# Patient Record
Sex: Male | Born: 1960 | Race: Black or African American | Hispanic: No | Marital: Married | Smoking: Current every day smoker
Health system: Southern US, Community
[De-identification: ages and names within clinical notes are randomized; demographics above are authoritative.]

---

## 2000-10-29 ENCOUNTER — Emergency Department (HOSPITAL_COMMUNITY): Admission: RE | Admit: 2000-10-29 | Discharge: 2000-10-30 | Payer: Self-pay | Admitting: Emergency Medicine

## 2000-10-29 ENCOUNTER — Encounter: Payer: Self-pay | Admitting: Emergency Medicine

## 2000-11-10 ENCOUNTER — Emergency Department (HOSPITAL_COMMUNITY): Admission: EM | Admit: 2000-11-10 | Discharge: 2000-11-10 | Payer: Self-pay | Admitting: Emergency Medicine

## 2002-01-28 ENCOUNTER — Inpatient Hospital Stay (HOSPITAL_COMMUNITY): Admission: RE | Admit: 2002-01-28 | Discharge: 2002-01-29 | Payer: Self-pay | Admitting: Emergency Medicine

## 2002-01-31 ENCOUNTER — Emergency Department (HOSPITAL_COMMUNITY): Admission: EM | Admit: 2002-01-31 | Discharge: 2002-01-31 | Payer: Self-pay | Admitting: Emergency Medicine

## 2002-07-14 ENCOUNTER — Observation Stay (HOSPITAL_COMMUNITY): Admission: AC | Admit: 2002-07-14 | Discharge: 2002-07-16 | Payer: Self-pay

## 2005-05-20 ENCOUNTER — Emergency Department (HOSPITAL_COMMUNITY): Admission: EM | Admit: 2005-05-20 | Discharge: 2005-05-20 | Payer: Self-pay | Admitting: Emergency Medicine

## 2006-01-24 ENCOUNTER — Inpatient Hospital Stay (HOSPITAL_COMMUNITY): Admission: EM | Admit: 2006-01-24 | Discharge: 2006-01-25 | Payer: Self-pay | Admitting: Emergency Medicine

## 2006-11-29 ENCOUNTER — Emergency Department (HOSPITAL_COMMUNITY): Admission: EM | Admit: 2006-11-29 | Discharge: 2006-11-29 | Payer: Self-pay | Admitting: Emergency Medicine

## 2011-05-31 ENCOUNTER — Emergency Department (HOSPITAL_COMMUNITY)
Admission: EM | Admit: 2011-05-31 | Discharge: 2011-06-01 | Disposition: A | Discharge status: Home / Self Care | Payer: Self-pay | Attending: Emergency Medicine | Admitting: Emergency Medicine

## 2011-05-31 DIAGNOSIS — K625 Hemorrhage of anus and rectum: Secondary | ICD-10-CM | POA: Insufficient documentation

## 2011-05-31 DIAGNOSIS — K644 Residual hemorrhoidal skin tags: Secondary | ICD-10-CM | POA: Insufficient documentation

## 2011-05-31 DIAGNOSIS — F1010 Alcohol abuse, uncomplicated: Secondary | ICD-10-CM | POA: Insufficient documentation

## 2011-05-31 LAB — DIFFERENTIAL
Basophils Absolute: 0.1 10*3/uL (ref 0.0–0.1)
Basophils Relative: 1 % (ref 0–1)
Eosinophils Absolute: 0.2 10*3/uL (ref 0.0–0.7)
Eosinophils Relative: 3 % (ref 0–5)
Lymphocytes Relative: 57 % — ABNORMAL HIGH (ref 12–46)
Lymphs Abs: 3.6 10*3/uL (ref 0.7–4.0)
Monocytes Absolute: 0.4 10*3/uL (ref 0.1–1.0)
Monocytes Relative: 7 % (ref 3–12)
Neutro Abs: 2 10*3/uL (ref 1.7–7.7)
Neutrophils Relative %: 32 % — ABNORMAL LOW (ref 43–77)

## 2011-05-31 LAB — COMPREHENSIVE METABOLIC PANEL
ALT: 27 U/L (ref 0–53)
AST: 40 U/L — ABNORMAL HIGH (ref 0–37)
Albumin: 2.8 g/dL — ABNORMAL LOW (ref 3.5–5.2)
Alkaline Phosphatase: 105 U/L (ref 39–117)
BUN: 8 mg/dL (ref 6–23)
CO2: 22 mEq/L (ref 19–32)
Calcium: 8.6 mg/dL (ref 8.4–10.5)
Chloride: 104 mEq/L (ref 96–112)
Creatinine, Ser: 0.76 mg/dL (ref 0.50–1.35)
GFR calc Af Amer: >60 mL/min (ref >60)
GFR calc non Af Amer: >60 mL/min (ref >60)
Glucose, Bld: 100 mg/dL — ABNORMAL HIGH (ref 70–99)
Potassium: 4 mEq/L (ref 3.5–5.1)
Sodium: 137 mEq/L (ref 135–145)
Total Bilirubin: 0.2 mg/dL — ABNORMAL LOW (ref 0.3–1.2)
Total Protein: 8 g/dL (ref 6.0–8.3)

## 2011-05-31 LAB — ETHANOL: Alcohol, Ethyl (B): 238 mg/dL — ABNORMAL HIGH (ref 0–11)

## 2011-05-31 LAB — CBC
HCT: 36.8 % — ABNORMAL LOW (ref 39.0–52.0)
Hemoglobin: 13.3 g/dL (ref 13.0–17.0)
MCH: 31.1 pg (ref 26.0–34.0)
MCHC: 36.1 g/dL — ABNORMAL HIGH (ref 30.0–36.0)
MCV: 86.2 fL (ref 78.0–100.0)
Platelets: 221 10*3/uL (ref 150–400)
RBC: 4.27 MIL/uL (ref 4.22–5.81)
RDW: 14.7 % (ref 11.5–15.5)
WBC: 6.2 10*3/uL (ref 4.0–10.5)

## 2016-10-04 ENCOUNTER — Emergency Department (HOSPITAL_COMMUNITY): Payer: Self-pay

## 2016-10-04 ENCOUNTER — Encounter (HOSPITAL_COMMUNITY): Payer: Self-pay | Admitting: *Deleted

## 2016-10-04 ENCOUNTER — Emergency Department (HOSPITAL_COMMUNITY)
Admission: EM | Admit: 2016-10-04 | Discharge: 2016-10-04 | Disposition: A | Discharge status: Home / Self Care | Payer: Self-pay | Attending: Emergency Medicine | Admitting: Emergency Medicine

## 2016-10-04 DIAGNOSIS — F17200 Nicotine dependence, unspecified, uncomplicated: Secondary | ICD-10-CM | POA: Insufficient documentation

## 2016-10-04 DIAGNOSIS — S01112A Laceration without foreign body of left eyelid and periocular area, initial encounter: Secondary | ICD-10-CM | POA: Insufficient documentation

## 2016-10-04 DIAGNOSIS — S0181XA Laceration without foreign body of other part of head, initial encounter: Secondary | ICD-10-CM

## 2016-10-04 DIAGNOSIS — R51 Headache: Secondary | ICD-10-CM | POA: Insufficient documentation

## 2016-10-04 DIAGNOSIS — Y9289 Other specified places as the place of occurrence of the external cause: Secondary | ICD-10-CM | POA: Insufficient documentation

## 2016-10-04 DIAGNOSIS — M542 Cervicalgia: Secondary | ICD-10-CM | POA: Insufficient documentation

## 2016-10-04 DIAGNOSIS — M25531 Pain in right wrist: Secondary | ICD-10-CM | POA: Insufficient documentation

## 2016-10-04 DIAGNOSIS — Y999 Unspecified external cause status: Secondary | ICD-10-CM | POA: Insufficient documentation

## 2016-10-04 DIAGNOSIS — Y939 Activity, unspecified: Secondary | ICD-10-CM | POA: Insufficient documentation

## 2016-10-04 DIAGNOSIS — S0232XA Fracture of orbital floor, left side, initial encounter for closed fracture: Secondary | ICD-10-CM | POA: Insufficient documentation

## 2016-10-04 DIAGNOSIS — S0285XB Fracture of orbit, unspecified, initial encounter for open fracture: Secondary | ICD-10-CM

## 2016-10-04 MED ORDER — LIDOCAINE-EPINEPHRINE-TETRACAINE (LET) SOLUTION
3.0000 mL | Freq: Once | NASAL | Status: AC
  Administered 2016-10-04: 3 mL via TOPICAL
  Filled 2016-10-04: qty 3

## 2016-10-04 MED ORDER — ERYTHROMYCIN 5 MG/GM OP OINT: TOPICAL_OINTMENT | OPHTHALMIC | 1 refills | Status: AC

## 2016-10-04 MED ORDER — NAPROXEN 250 MG PO TABS: 250.0000 mg | ORAL_TABLET | Freq: Two times a day (BID) | ORAL | 0 refills | Status: AC

## 2016-10-04 MED ORDER — LIDOCAINE HCL (PF) 1 % IJ SOLN
5.0000 mL | Freq: Once | INTRAMUSCULAR | Status: AC
  Administered 2016-10-04: 5 mL
  Filled 2016-10-04: qty 5

## 2016-10-04 MED ORDER — TETANUS-DIPHTH-ACELL PERTUSSIS 5-2.5-18.5 LF-MCG/0.5 IM SUSP
0.5000 mL | Freq: Once | INTRAMUSCULAR | Status: AC
  Administered 2016-10-04: 0.5 mL via INTRAMUSCULAR
  Filled 2016-10-04: qty 0.5

## 2016-10-04 NOTE — Discharge Instructions (Signed)
Stitches out in 5-7 days. Please place erythromycin ointment twice a day over wound. Please follow up with ophthalmologist Dr. Toni ArthursFuller. If you have double vision, trouble with your vision or trouble moving your eyes you need to return to the ER immediately.

## 2016-10-04 NOTE — ED Notes (Signed)
Declined W/C at D/C and was escorted to lobby by RN. 
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
## 2016-10-04 NOTE — ED Provider Notes (Signed)
Laceration repair by me. See Will Dansie's note for further   LACERATION REPAIR Performed by: Georgiana ShoreJessica B Mitchell Authorized by: Georgiana ShoreJessica B Mitchell Consent: Verbal consent obtained. Risks and benefits: risks, benefits and alternatives were discussed Consent given by: patient Patient identity confirmed: provided demographic data Prepped and Draped in normal sterile fashion Wound explored  Laceration Location: left supraorbital  Laceration Length: 3 cm  No Foreign Bodies seen or palpated  Anesthesia: local infiltration  Local anesthetic: lidocaine 1% w/o epinephrine  Anesthetic total: 1 ml  Irrigation method: syringe Amount of cleaning: standard  Skin closure: 5.0 prolene  Number of sutures: 3  Technique: simple interrupted  Patient tolerance: Patient tolerated the procedure well with no immediate complications.   Georgiana ShoreJessica B Mitchell, PA-C 10/04/16 1515    Laurence Spatesachel Morgan Little, MD 10/05/16 1523    Laurence Spatesachel Morgan Little, MD 10/05/16 [id number]

## 2020-01-21 ENCOUNTER — Emergency Department (HOSPITAL_COMMUNITY): Payer: Self-pay

## 2020-01-21 ENCOUNTER — Other Ambulatory Visit: Payer: Self-pay

## 2020-01-21 ENCOUNTER — Emergency Department (HOSPITAL_COMMUNITY)
Admission: EM | Admit: 2020-01-21 | Discharge: 2020-01-22 | Disposition: A | Discharge status: Home / Self Care | Payer: Self-pay | Attending: Emergency Medicine | Admitting: Emergency Medicine

## 2020-01-21 DIAGNOSIS — Y908 Blood alcohol level of 240 mg/100 ml or more: Secondary | ICD-10-CM | POA: Insufficient documentation

## 2020-01-21 DIAGNOSIS — S0990XA Unspecified injury of head, initial encounter: Secondary | ICD-10-CM | POA: Insufficient documentation

## 2020-01-21 DIAGNOSIS — Y9389 Activity, other specified: Secondary | ICD-10-CM | POA: Insufficient documentation

## 2020-01-21 DIAGNOSIS — F17210 Nicotine dependence, cigarettes, uncomplicated: Secondary | ICD-10-CM | POA: Insufficient documentation

## 2020-01-21 DIAGNOSIS — Y929 Unspecified place or not applicable: Secondary | ICD-10-CM | POA: Insufficient documentation

## 2020-01-21 DIAGNOSIS — R52 Pain, unspecified: Secondary | ICD-10-CM

## 2020-01-21 DIAGNOSIS — F10929 Alcohol use, unspecified with intoxication, unspecified: Secondary | ICD-10-CM | POA: Insufficient documentation

## 2020-01-21 DIAGNOSIS — Z791 Long term (current) use of non-steroidal anti-inflammatories (NSAID): Secondary | ICD-10-CM | POA: Insufficient documentation

## 2020-01-21 DIAGNOSIS — F10920 Alcohol use, unspecified with intoxication, uncomplicated: Secondary | ICD-10-CM

## 2020-01-21 DIAGNOSIS — Y999 Unspecified external cause status: Secondary | ICD-10-CM | POA: Insufficient documentation

## 2020-01-21 LAB — CBC
HCT: 43.3 % (ref 39.0–52.0)
Hemoglobin: 14 g/dL (ref 13.0–17.0)
MCH: 31 pg (ref 26.0–34.0)
MCHC: 32.3 g/dL (ref 30.0–36.0)
MCV: 96 fL (ref 80.0–100.0)
Platelets: 211 10*3/uL (ref 150–400)
RBC: 4.51 MIL/uL (ref 4.22–5.81)
RDW: 14.2 % (ref 11.5–15.5)
WBC: 7.4 10*3/uL (ref 4.0–10.5)
nRBC: 0 % (ref 0.0–0.2)

## 2020-01-21 LAB — COMPREHENSIVE METABOLIC PANEL
ALT: 18 U/L (ref 0–44)
AST: 30 U/L (ref 15–41)
Albumin: 3.3 g/dL — ABNORMAL LOW (ref 3.5–5.0)
Alkaline Phosphatase: 78 U/L (ref 38–126)
Anion gap: 11 (ref 5–15)
BUN: 7 mg/dL (ref 6–20)
CO2: 21 mmol/L — ABNORMAL LOW (ref 22–32)
Calcium: 8.6 mg/dL — ABNORMAL LOW (ref 8.9–10.3)
Chloride: 110 mmol/L (ref 98–111)
Creatinine, Ser: 0.76 mg/dL (ref 0.61–1.24)
GFR calc Af Amer: >60 mL/min (ref >60)
GFR calc non Af Amer: >60 mL/min (ref >60)
Glucose, Bld: 114 mg/dL — ABNORMAL HIGH (ref 70–99)
Potassium: 3.7 mmol/L (ref 3.5–5.1)
Sodium: 142 mmol/L (ref 135–145)
Total Bilirubin: 0.4 mg/dL (ref 0.3–1.2)
Total Protein: 7.4 g/dL (ref 6.5–8.1)

## 2020-01-21 LAB — ETHANOL: Alcohol, Ethyl (B): 289 mg/dL — ABNORMAL HIGH (ref <10)
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
## 2020-01-21 NOTE — ED Notes (Signed)
Pt removed cardiac monitor and refusing to be put back on monitor.

## 2020-01-21 NOTE — ED Notes (Signed)
Pt transported to CT ?

## 2020-01-22 NOTE — ED Notes (Signed)
Pt is dressed and ambulating steadily in room

## 2020-01-22 NOTE — ED Notes (Signed)
Pt asleep, sats 88-90%. Placed on [facility] while asleep

## 2020-01-22 NOTE — ED Notes (Signed)
Patient verbalizes understanding of discharge instructions. Opportunity for questioning and answers were provided. Armband removed by staff, pt discharged from ED. Ambulated out to lobby  

## 2020-01-22 NOTE — ED Provider Notes (Signed)
5:56 AM Patient reassessed several times through the night.  He is alert and oriented.  Ambulating and eating/drinking.  No additional symptoms.  Appears stable for discharge.   Roxy Horseman, PA-C 01/22/20 0557    Glynn Octave, MD 01/22/20 [phone]

## 2020-01-22 NOTE — ED Notes (Signed)
Pt given [place] sandwich and sprite, sitting up and eating

## 2020-02-25 ENCOUNTER — Ambulatory Visit: Payer: Self-pay

## 2021-10-10 ENCOUNTER — Other Ambulatory Visit: Payer: Self-pay

## 2021-10-10 ENCOUNTER — Emergency Department (HOSPITAL_COMMUNITY): Payer: Self-pay

## 2021-10-10 ENCOUNTER — Encounter (HOSPITAL_COMMUNITY): Payer: Self-pay | Admitting: Emergency Medicine

## 2021-10-10 ENCOUNTER — Emergency Department (HOSPITAL_COMMUNITY)
Admission: EM | Admit: 2021-10-10 | Discharge: 2021-10-10 | Disposition: A | Discharge status: Home / Self Care | Payer: Self-pay | Attending: Emergency Medicine | Admitting: Emergency Medicine

## 2021-10-10 DIAGNOSIS — Z5321 Procedure and treatment not carried out due to patient leaving prior to being seen by health care provider: Secondary | ICD-10-CM | POA: Insufficient documentation

## 2021-10-10 DIAGNOSIS — F10129 Alcohol abuse with intoxication, unspecified: Secondary | ICD-10-CM | POA: Insufficient documentation

## 2021-10-10 DIAGNOSIS — F1010 Alcohol abuse, uncomplicated: Secondary | ICD-10-CM

## 2021-10-10 DIAGNOSIS — Y908 Blood alcohol level of 240 mg/100 ml or more: Secondary | ICD-10-CM | POA: Insufficient documentation

## 2021-10-10 DIAGNOSIS — R142 Eructation: Secondary | ICD-10-CM | POA: Insufficient documentation

## 2021-10-10 DIAGNOSIS — R0789 Other chest pain: Secondary | ICD-10-CM | POA: Insufficient documentation

## 2021-10-10 LAB — CBC WITH DIFFERENTIAL/PLATELET
Abs Immature Granulocytes: 0.02 10*3/uL (ref 0.00–0.07)
Basophils Absolute: 0.1 10*3/uL (ref 0.0–0.1)
Basophils Relative: 1 %
Eosinophils Absolute: 0.2 10*3/uL (ref 0.0–0.5)
Eosinophils Relative: 3 %
HCT: 43.6 % (ref 39.0–52.0)
Hemoglobin: 15.2 g/dL (ref 13.0–17.0)
Immature Granulocytes: 0 %
Lymphocytes Relative: 66 %
Lymphs Abs: 4 10*3/uL (ref 0.7–4.0)
MCH: 32.2 pg (ref 26.0–34.0)
MCHC: 34.9 g/dL (ref 30.0–36.0)
MCV: 92.4 fL (ref 80.0–100.0)
Monocytes Absolute: 0.3 10*3/uL (ref 0.1–1.0)
Monocytes Relative: 5 %
Neutro Abs: 1.6 10*3/uL — ABNORMAL LOW (ref 1.7–7.7)
Neutrophils Relative %: 25 %
Platelets: 207 10*3/uL (ref 150–400)
RBC: 4.72 MIL/uL (ref 4.22–5.81)
RDW: 13.7 % (ref 11.5–15.5)
WBC: 6.2 10*3/uL (ref 4.0–10.5)
nRBC: 0 % (ref 0.0–0.2)

## 2021-10-10 LAB — COMPREHENSIVE METABOLIC PANEL
ALT: 22 U/L (ref 0–44)
AST: 38 U/L (ref 15–41)
Albumin: 3.4 g/dL — ABNORMAL LOW (ref 3.5–5.0)
Alkaline Phosphatase: 84 U/L (ref 38–126)
Anion gap: 10 (ref 5–15)
BUN: 9 mg/dL (ref 6–20)
CO2: 20 mmol/L — ABNORMAL LOW (ref 22–32)
Calcium: 8.7 mg/dL — ABNORMAL LOW (ref 8.9–10.3)
Chloride: 107 mmol/L (ref 98–111)
Creatinine, Ser: 0.92 mg/dL (ref 0.61–1.24)
GFR, Estimated: >60 mL/min (ref >60)
Glucose, Bld: 111 mg/dL — ABNORMAL HIGH (ref 70–99)
Potassium: 3.5 mmol/L (ref 3.5–5.1)
Sodium: 137 mmol/L (ref 135–145)
Total Bilirubin: 0.5 mg/dL (ref 0.3–1.2)
Total Protein: 7.9 g/dL (ref 6.5–8.1)

## 2021-10-10 LAB — ETHANOL: Alcohol, Ethyl (B): 389 mg/dL (ref <10)

## 2021-10-10 LAB — LIPASE, BLOOD: Lipase: 45 U/L (ref 11–51)

## 2021-10-10 NOTE — ED Notes (Signed)
Pt cursing at staff to take him to the lobby. Wheeled to lobby exit
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
## 2022-10-14 IMAGING — CR DG RIBS W/ CHEST 3+V*L*
4 series · 4 of 4 positions shown · non-contrast
Comparison: Chest radiograph 01/21/2020

CLINICAL DATA: Left rib pain

EXAM:
LEFT RIBS AND CHEST - 3+ VIEW

[rib ap obl]
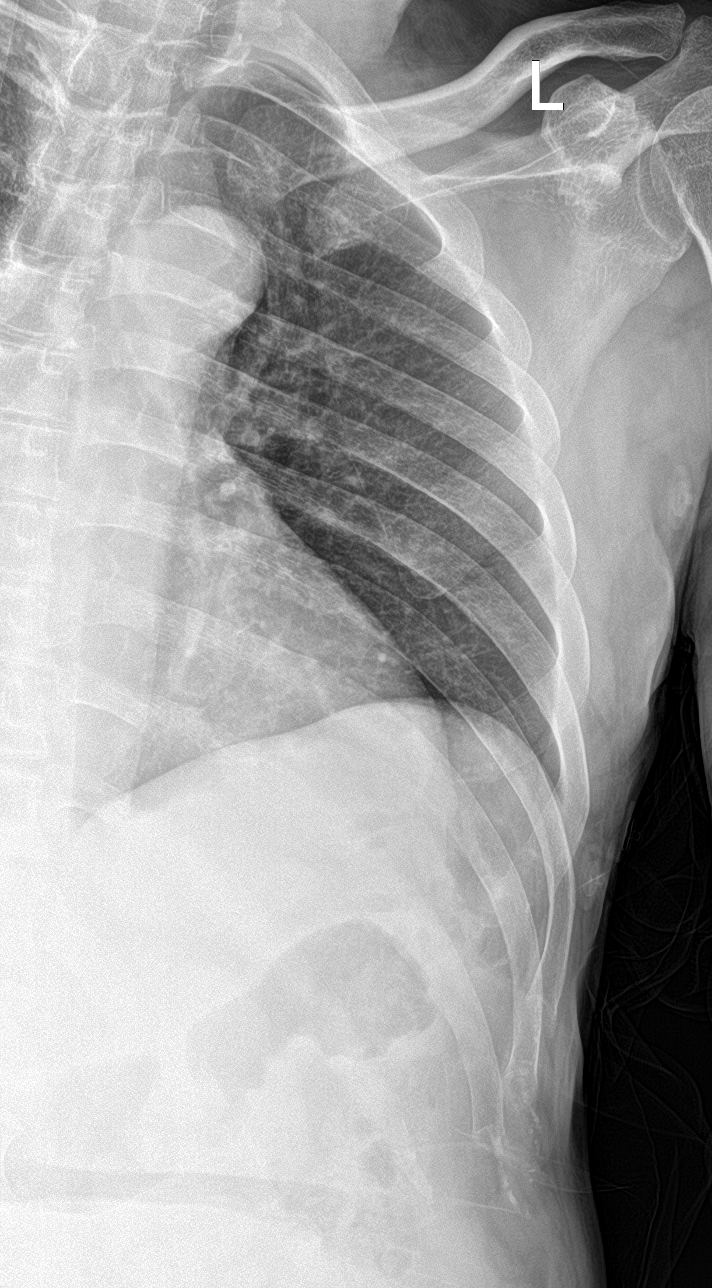

[rib ap (1 of 2)]
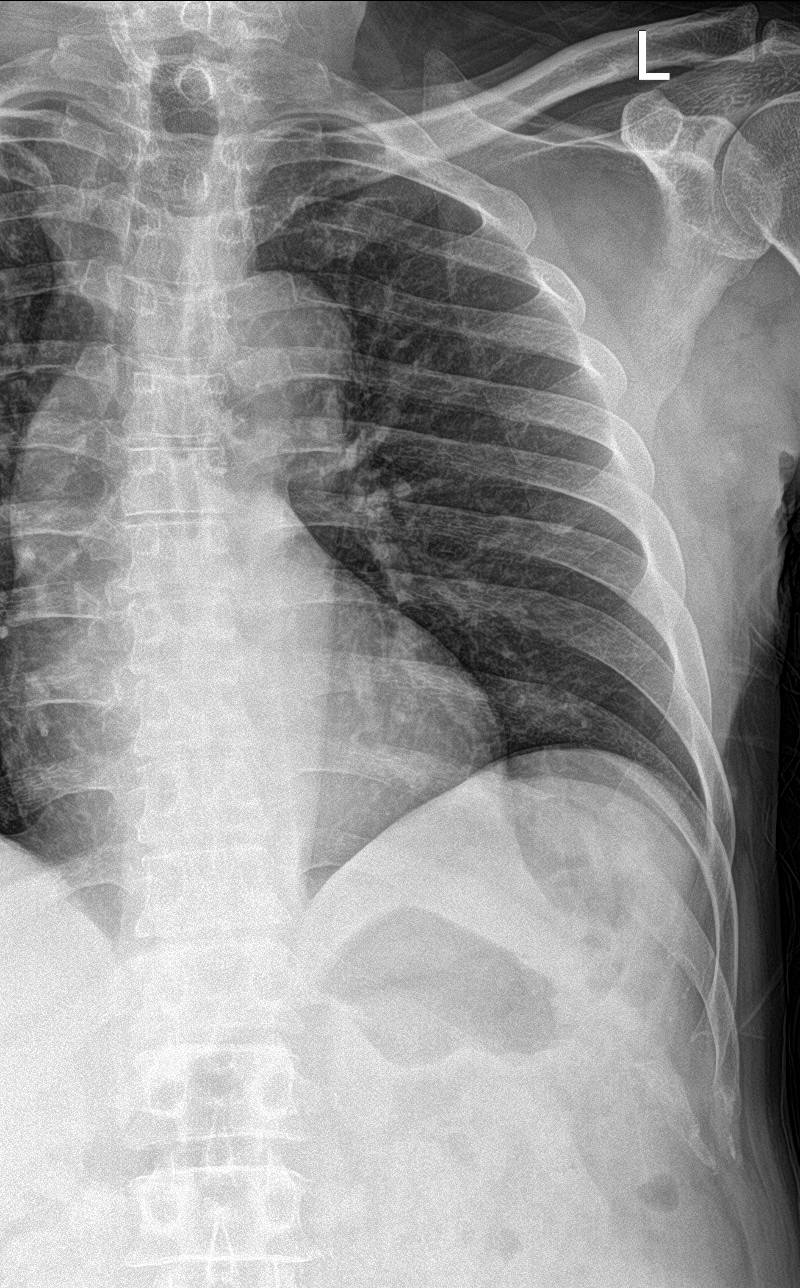

[chest ap]
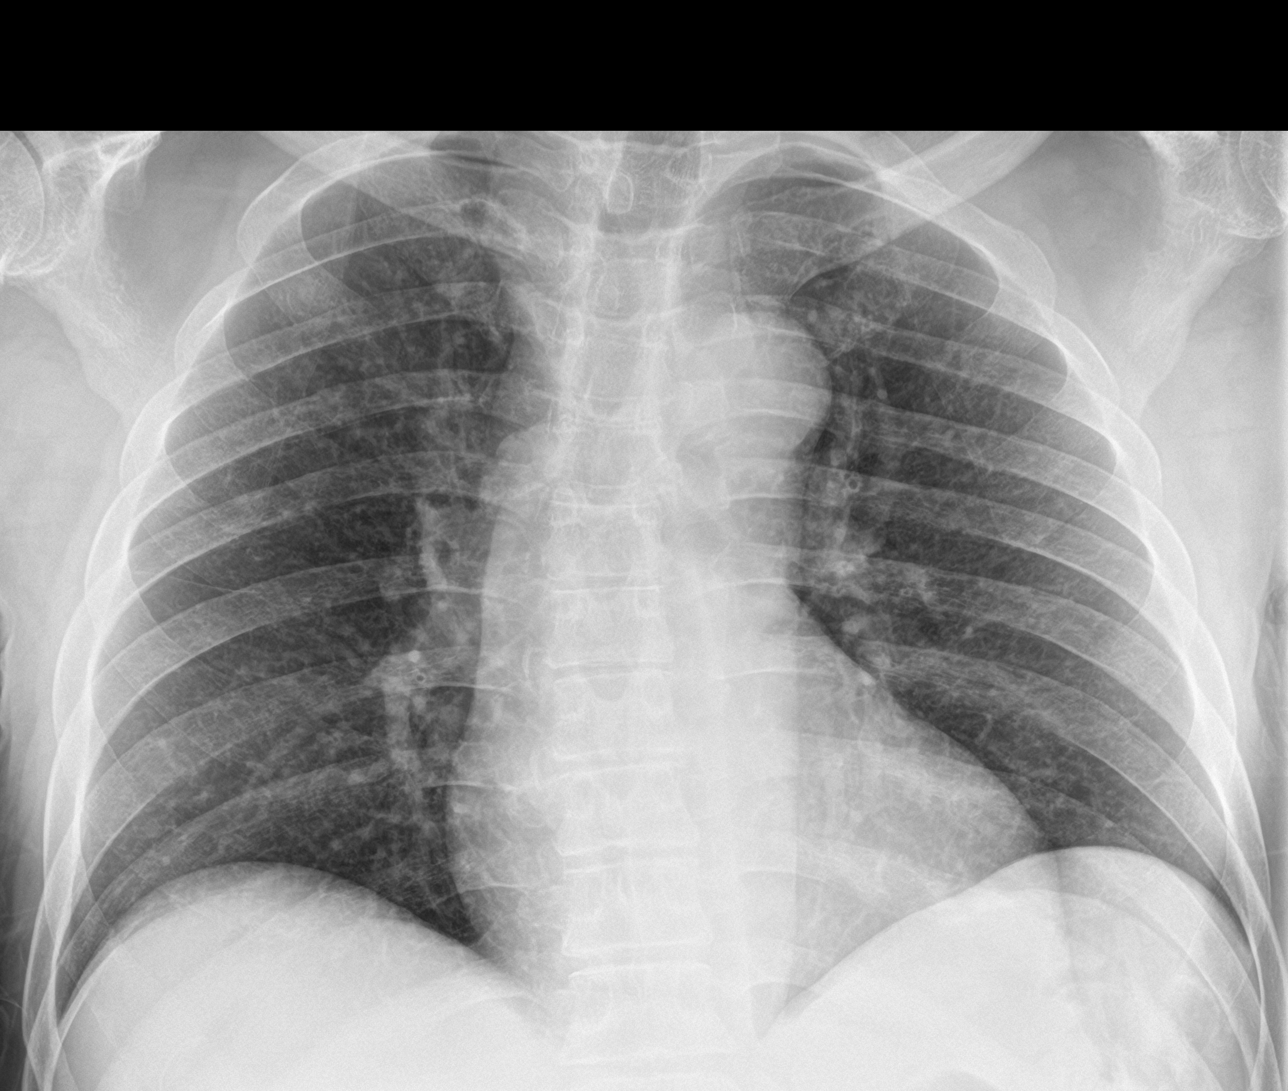

[rib ap (2 of 2)]
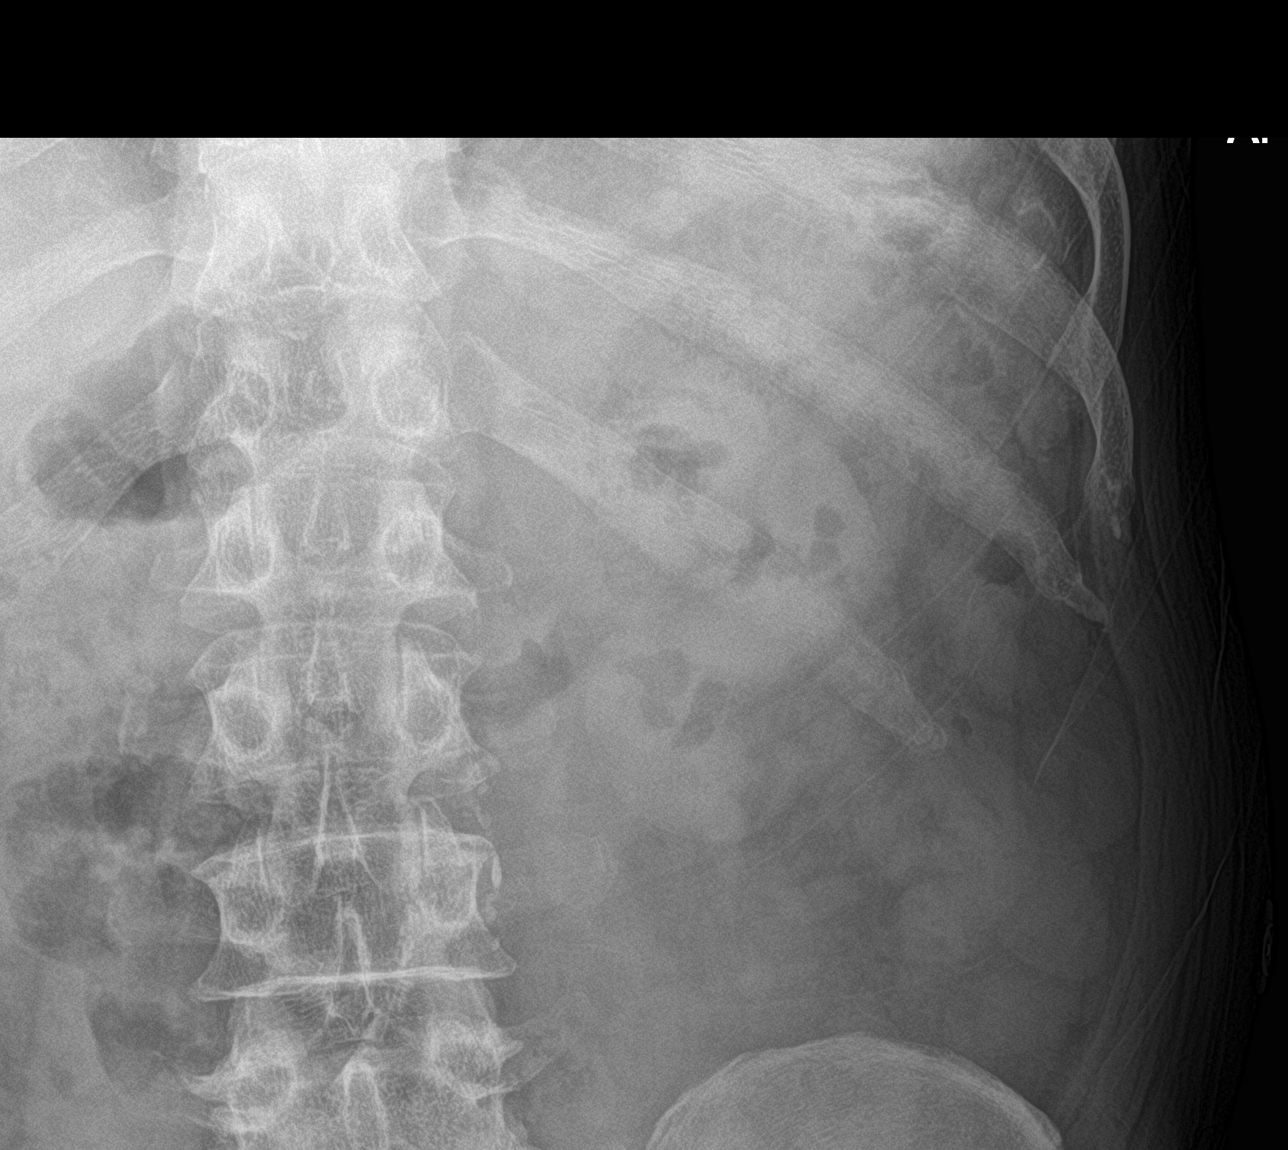

[4 of 4 positions shown; findings below may reference images not displayed]

FINDINGS: The lungs appear clear. Cardiac and mediastinal contours normal. No
pleural effusion identified.

No displaced rib fracture is identified.
IMPRESSION: 1. No significant abnormality identified. Please note that
nondisplaced rib fractures can be occult on conventional
radiography.

## 2023-12-04 ENCOUNTER — Emergency Department (HOSPITAL_COMMUNITY)
Admission: EM | Admit: 2023-12-04 | Discharge: 2023-12-04 | Disposition: A | Discharge status: Home / Self Care | Payer: Medicaid Other | Attending: Emergency Medicine | Admitting: Emergency Medicine

## 2023-12-04 ENCOUNTER — Other Ambulatory Visit: Payer: Self-pay

## 2023-12-04 DIAGNOSIS — L03116 Cellulitis of left lower limb: Secondary | ICD-10-CM | POA: Insufficient documentation

## 2023-12-04 DIAGNOSIS — L03115 Cellulitis of right lower limb: Secondary | ICD-10-CM | POA: Insufficient documentation

## 2023-12-04 DIAGNOSIS — L03119 Cellulitis of unspecified part of limb: Secondary | ICD-10-CM

## 2023-12-04 DIAGNOSIS — R21 Rash and other nonspecific skin eruption: Secondary | ICD-10-CM | POA: Diagnosis present

## 2023-12-04 MED ORDER — CEPHALEXIN 500 MG PO CAPS: 500.0000 mg | ORAL_CAPSULE | Freq: Four times a day (QID) | ORAL | 0 refills | Status: AC

## 2023-12-04 MED ORDER — FLUCONAZOLE 150 MG PO TABS: 150.0000 mg | ORAL_TABLET | ORAL | 0 refills | Status: AC

## 2023-12-04 NOTE — ED Provider Notes (Signed)
[place] EMERGENCY DEPARTMENT AT [facility] Provider Note   CSN: [id number] Arrival date & time: 12/04/23  0944     History  Chief Complaint  Patient presents with   lesions on leg    Jon Gilbert is a 63 y.o. male.  The history is provided by the patient and medical records. No language interpreter was used.  Rash Location:  Leg Leg rash location:  L lower leg and R lower leg Quality: itchiness, painful, redness and scaling   Pain details:    Quality:  Aching   Severity:  Moderate   Onset quality:  Gradual   Duration:  2 weeks   Timing:  Constant   Progression:  Worsening Severity:  Moderate Onset quality:  Gradual Timing:  Constant Progression:  Spreading Chronicity:  New Relieved by:  Nothing Worsened by:  Nothing Ineffective treatments:  None tried Associated symptoms: induration   Associated symptoms: no abdominal pain, no diarrhea, no fatigue, no fever, no headaches, no joint pain, no myalgias, no nausea, no shortness of breath, no URI, not vomiting and not wheezing        Home Medications Prior to Admission medications   Medication Sig Start Date End Date Taking? Authorizing Provider  erythromycin ophthalmic ointment Place a 1/2 inch ribbon of ointment over upper eye wound Patient not taking: Reported on 01/21/2020 10/04/16   Everlene Farrier, PA-C  naproxen (NAPROSYN) 250 MG tablet Take 1 tablet (250 mg total) by mouth 2 (two) times daily with a meal. Patient not taking: Reported on 01/21/2020 10/04/16   Everlene Farrier, PA-C      Allergies    Patient has no known allergies.    Review of Systems   Review of Systems  Constitutional:  Negative for chills, fatigue and fever.  HENT:  Negative for congestion.   Respiratory:  Negative for cough, chest tightness, shortness of breath and wheezing.   Gastrointestinal:  Negative for abdominal pain, diarrhea, nausea and vomiting.  Genitourinary:  Negative for dysuria.  Musculoskeletal:   Negative for arthralgias, myalgias and neck pain.  Skin:  Positive for rash. Negative for wound.  Neurological:  Negative for weakness, light-headedness and headaches.  Psychiatric/Behavioral:  Negative for agitation and confusion.   All other systems reviewed and are negative.   Physical Exam Updated Vital Signs BP (!) 187/115 (BP Location: Left Arm)   Pulse 90   Temp (!) 97.5 F (36.4 C)   Resp 19   Ht 5\' 10"  (1.778 m)   Wt 83.9 kg   SpO2 99%   BMI 26.54 kg/m  Physical Exam Vitals and nursing note reviewed.  Constitutional:      General: He is not in acute distress.    Appearance: He is well-developed. He is not ill-appearing, toxic-appearing or diaphoretic.  HENT:     Head: Normocephalic and atraumatic.     Nose: Nose normal.     Mouth/Throat:     Mouth: Mucous membranes are moist.  Eyes:     Conjunctiva/sclera: Conjunctivae normal.  Cardiovascular:     Rate and Rhythm: Normal rate and regular rhythm.     Heart sounds: No murmur heard. Pulmonary:     Effort: Pulmonary effort is normal. No respiratory distress.     Breath sounds: Normal breath sounds. No wheezing, rhonchi or rales.  Chest:     Chest wall: No tenderness.  Abdominal:     General: Abdomen is flat.     Palpations: Abdomen is soft.     Tenderness:  There is no abdominal tenderness. There is no guarding or rebound.  Musculoskeletal:        General: Tenderness present. No swelling.     Cervical back: Neck supple.     Comments: Patient has rash on both shins that is scaling, erythematous, and tender.  It is primarily on the right leg but also now on the left leg.  Intact sensation strength and pulses distally.  No crepitance.  No bony tenderness.  No knee tenderness or hip tenderness.  Normal gait.  Patient otherwise resting.  Skin:    General: Skin is warm and dry.     Capillary Refill: Capillary refill takes less than 2 seconds.     Findings: Erythema and rash present.  Neurological:     General: No  focal deficit present.     Mental Status: He is alert.     Sensory: No sensory deficit.     Motor: No weakness.  Psychiatric:        Mood and Affect: Mood normal.     ED Results / Procedures / Treatments   Labs (all labs ordered are listed, but only abnormal results are displayed) Labs Reviewed - No data to display  EKG None  Radiology No results found.  Procedures Procedures    Medications Ordered in ED Medications - No data to display  ED Course/ Medical Decision Making/ A&P                                 Medical Decision Making   Jon Gilbert is a 63 y.o. male with no significant past medical history who presents with rash on both legs.  According to patient, for the last 2 weeks or so she has noticed a rash on the shin on his right leg and then over the last week or so it developed on the left leg.  He reports it is painful and itching.  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  He denies any laceration but may have had an abrasion.  He denies any fevers, chills, congestion, cough, nausea, vomiting, constipation, diarrhea, or urinary changes peer denies any numbness or weakness.  Denies history of significant skin infections.  Denies any other medical troubles.  On exam, lungs clear and chest nontender.  Abdomen nontender.  Back nontender.  Hips and knees nontender.  Patient has rash on his right shin primarily that is scaling and annular and appearance.  It is warm and there was some tenderness but no crepitance.  No fluctuance to suggest abscess.  No active drainage seen.  Appearance of rash seems to look somewhat fungal in quality.  There is also several areas on his left shin now with it.  The distribution of both legs does not seem consistent with shingles however I am concerned about either cellulitis or fungal infection.  He also could have  self inoculated yes he does say it is itching and he tries not to scratch it too much.  We did shared decision-making conversation and agreed to start him on both an antibiotic and oral antifungal medication to help treat it.  We agreed to hold on imaging given his lack of other trauma and will hold on extensive lab testing given his lack of systemic infection symptoms at this time.  With no fevers chills or spreading pain, we will hold on further workup.  We did discuss that this could be more of an inflammatory skin condition such as eczema lupus, or other inflammatory syndrome however given the appearance will initially treat for possible infection.  If this does not improve, anticipate further workup and evaluation to rule out other causes.  Patient agrees and will get prescription for Keflex and fluconazole and he understands return precautions and follow-up instructions.  He will follow-up with a PCP.  He understood return precautions symptoms worsen and patient was discharged in good condition.                   Final Clinical Impression(s) / ED Diagnoses Final diagnoses:  Cellulitis of lower extremity, unspecified laterality    Rx / DC Orders ED Discharge Orders          Ordered    cephALEXin (KEFLEX) 500 MG capsule  4 times daily        12/04/23 1128    fluconazole (DIFLUCAN) 150 MG tablet  Weekly        12/04/23 1128           Clinical Impression: 1. Cellulitis of lower extremity, unspecified laterality     Disposition: Discharge  Condition: Good  I have discussed the results, Dx and Tx plan with the pt(& family if present). He/she/they expressed understanding and agree(s) with the plan. Discharge instructions discussed at great length. Strict return precautions discussed and pt &/or family have verbalized understanding of the instructions. No further questions at time of discharge.    New Prescriptions   CEPHALEXIN (KEFLEX) 500 MG CAPSULE    Take 1 capsule  (500 mg total) by mouth 4 (four) times daily for 7 days.   FLUCONAZOLE (DIFLUCAN) 150 MG TABLET    Take 1 tablet (150 mg total) by mouth once a week.    Follow Up: [facility] [address] [place] [address] [place] [address] [phone] Schedule an appointment as soon as possible for a visit       Mahrosh Donnell, Canary Brim, MD 12/04/23 1135

## 2023-12-04 NOTE — Discharge Instructions (Signed)
Your history, exam, and evaluation today seem consistent with a skin infection either from bacterial or fungal infection.  Will treat you both with antibiotic and antifungal medication.  Please take the antibiotics for the next week and take the antifungal medicine once weekly for the next 5 weeks.  Please rest and stay hydrated.  Please follow with a primary doctor.  If any symptoms change or worsen acutely, please return to the nearest emergency department.

## 2023-12-04 NOTE — ED Notes (Signed)
Dc instructions and scripts reviewed with pt no questions or concerns at this time.  

## 2023-12-04 NOTE — ED Triage Notes (Signed)
Pt. Stated, I have sores, lesions or a rash on rt. Leg for a month and now going to the other leg .

## 2024-01-14 ENCOUNTER — Encounter (HOSPITAL_COMMUNITY): Payer: Self-pay | Admitting: Emergency Medicine

## 2024-01-14 ENCOUNTER — Other Ambulatory Visit: Payer: Self-pay

## 2024-01-14 ENCOUNTER — Emergency Department (HOSPITAL_COMMUNITY)
Admission: EM | Admit: 2024-01-14 | Discharge: 2024-01-14 | Disposition: A | Discharge status: Home / Self Care | Attending: Emergency Medicine | Admitting: Emergency Medicine

## 2024-01-14 DIAGNOSIS — R030 Elevated blood-pressure reading, without diagnosis of hypertension: Secondary | ICD-10-CM | POA: Insufficient documentation

## 2024-01-14 DIAGNOSIS — M7989 Other specified soft tissue disorders: Secondary | ICD-10-CM | POA: Diagnosis present

## 2024-01-14 DIAGNOSIS — L309 Dermatitis, unspecified: Secondary | ICD-10-CM | POA: Diagnosis not present

## 2024-01-14 LAB — CBC WITH DIFFERENTIAL/PLATELET
Abs Immature Granulocytes: 0.03 10*3/uL (ref 0.00–0.07)
Basophils Absolute: 0 10*3/uL (ref 0.0–0.1)
Basophils Relative: 1 %
Eosinophils Absolute: 0.4 10*3/uL (ref 0.0–0.5)
Eosinophils Relative: 6 %
HCT: 45.1 % (ref 39.0–52.0)
Hemoglobin: 15 g/dL (ref 13.0–17.0)
Immature Granulocytes: 0 %
Lymphocytes Relative: 35 %
Lymphs Abs: 2.4 10*3/uL (ref 0.7–4.0)
MCH: 30.1 pg (ref 26.0–34.0)
MCHC: 33.3 g/dL (ref 30.0–36.0)
MCV: 90.4 fL (ref 80.0–100.0)
Monocytes Absolute: 0.7 10*3/uL (ref 0.1–1.0)
Monocytes Relative: 11 %
Neutro Abs: 3.1 10*3/uL (ref 1.7–7.7)
Neutrophils Relative %: 47 %
Platelets: 173 10*3/uL (ref 150–400)
RBC: 4.99 MIL/uL (ref 4.22–5.81)
RDW: 14.4 % (ref 11.5–15.5)
WBC: 6.7 10*3/uL (ref 4.0–10.5)
nRBC: 0 % (ref 0.0–0.2)

## 2024-01-14 LAB — BASIC METABOLIC PANEL
Anion gap: 11 (ref 5–15)
BUN: 9 mg/dL (ref 8–23)
CO2: 20 mmol/L — ABNORMAL LOW (ref 22–32)
Calcium: 9.2 mg/dL (ref 8.9–10.3)
Chloride: 107 mmol/L (ref 98–111)
Creatinine, Ser: 0.92 mg/dL (ref 0.61–1.24)
GFR, Estimated: >60 mL/min (ref >60)
Glucose, Bld: 120 mg/dL — ABNORMAL HIGH (ref 70–99)
Potassium: 3.5 mmol/L (ref 3.5–5.1)
Sodium: 138 mmol/L (ref 135–145)

## 2024-01-14 MED ORDER — TRIAMCINOLONE ACETONIDE 0.1 % EX CREA: 1.0000 | TOPICAL_CREAM | Freq: Two times a day (BID) | CUTANEOUS | 2 refills | Status: AC

## 2024-01-14 MED ORDER — DIPHENHYDRAMINE HCL 25 MG PO CAPS
25.0000 mg | ORAL_CAPSULE | Freq: Once | ORAL | Status: AC
  Administered 2024-01-14: 25 mg via ORAL
  Filled 2024-01-14: qty 1

## 2024-01-14 MED ORDER — METHYLPREDNISOLONE 4 MG PO TBPK: ORAL_TABLET | ORAL | 0 refills | Status: AC

## 2024-01-14 MED ORDER — OXYCODONE-ACETAMINOPHEN 5-325 MG PO TABS
1.0000 | ORAL_TABLET | Freq: Once | ORAL | Status: AC
  Administered 2024-01-14: 1 via ORAL
  Filled 2024-01-14: qty 1

## 2024-01-14 NOTE — ED Triage Notes (Signed)
 Pt states he was seen x 1 week ago for rash on BLE and swelling. Prescribed antibiotics but denies any improvement. Swelling noted to BLE with minor bleeding. Pt endorses itching.

## 2024-01-14 NOTE — ED Provider Triage Note (Signed)
 Emergency Medicine Provider Triage Evaluation Note  Jon Gilbert , a 64 y.o. male  was evaluated in triage.  Pt complains of leg pain and rash.  Patient reports rash on the bilateral lower extremities since 1/31.  He was started on Keflex without improvement of symptoms.  Denies any fevers, new soaps, detergents, or medications.  Review of Systems  Positive: Rash, leg pain or itching Negative:   Physical Exam  BP (!) 164/107 (BP Location: Right Arm)   Pulse 84   Temp 97.9 F (36.6 C)   Resp 16   SpO2 100%  Gen:   Awake, no distress   Resp:  Normal effort  MSK:   Moves extremities without difficulty  Other:  Rash on bilateral legs    Medical Decision Making  Medically screening exam initiated at 2:13 PM.  Appropriate orders placed.  Jon Gilbert was informed that the remainder of the evaluation will be completed by another provider, this initial triage assessment does not replace that evaluation, and the importance of remaining in the ED until their evaluation is complete.    Maxwell Marion, PA-C 01/14/24 [phone]

## 2024-01-14 NOTE — ED Provider Notes (Signed)
 [place] EMERGENCY DEPARTMENT AT [facility] Provider Note   CSN: [id number] Arrival date & time: 01/14/24  1315     History  Chief Complaint  Patient presents with   Leg Swelling    Jon Gilbert is a 63 y.o. male.  HPI   This patient is a 63 year old male, he denies chronic medical conditions although he thinks that he has some high blood pressure, he is not currently taking any medication for high blood pressure.  He had presented to the hospital a couple months ago with a rash on his bilateral legs which started in the last few months and has been daily persistent and is itchy.  Sometimes it itches to the point of hurting and has some mild associated swelling of the right lower extremity associated with it as well.  He was placed on cephalexin and fluconazole, he did not get any better and continues to have the itching.  It also involves a small amount on his forearms although it is to a much lesser degree.  It is not on his trunk his neck his face abdomen chest or back, it is not of the genital region and he has no eye itching no mouth sores, no mucous membrane involvement.  He takes no other daily medications, he does not go to the doctor.  He never made a follow-up appointment after his last visit.  He lives with his girlfriend and states that she does not have any rashes at all  Home Medications Prior to Admission medications   Medication Sig Start Date End Date Taking? Authorizing Provider  methylPREDNISolone (MEDROL DOSEPAK) 4 MG TBPK tablet Taper over 6 days 01/14/24  Yes Eber Hong, MD  triamcinolone cream (KENALOG) 0.1 % Apply 1 Application topically 2 (two) times daily. 01/14/24  Yes Eber Hong, MD  erythromycin ophthalmic ointment Place a 1/2 inch ribbon of ointment over upper eye wound Patient not taking: Reported on 01/21/2020 10/04/16   Everlene Farrier, PA-C  fluconazole (DIFLUCAN) 150 MG tablet Take 1 tablet (150 mg total) by mouth once a week.  12/04/23   Tegeler, Canary Brim, MD  naproxen (NAPROSYN) 250 MG tablet Take 1 tablet (250 mg total) by mouth 2 (two) times daily with a meal. Patient not taking: Reported on 01/21/2020 10/04/16   Everlene Farrier, PA-C      Allergies    Patient has no known allergies.    Review of Systems   Review of Systems  All other systems reviewed and are negative.   Physical Exam Updated Vital Signs BP (!) 164/107 (BP Location: Right Arm)   Pulse 84   Temp 97.9 F (36.6 C)   Resp 16   SpO2 100%  Physical Exam Vitals and nursing note reviewed.  Constitutional:      General: He is not in acute distress.    Appearance: He is well-developed.  HENT:     Head: Normocephalic and atraumatic.     Mouth/Throat:     Pharynx: No oropharyngeal exudate.  Eyes:     General: No scleral icterus.       Right eye: No discharge.        Left eye: No discharge.     Conjunctiva/sclera: Conjunctivae normal.     Pupils: Pupils are equal, round, and reactive to light.  Neck:     Thyroid: No thyromegaly.     Vascular: No JVD.  Cardiovascular:     Rate and Rhythm: Normal rate and regular rhythm.  Heart sounds: Normal heart sounds. No murmur heard.    No friction rub. No gallop.  Pulmonary:     Effort: Pulmonary effort is normal. No respiratory distress.     Breath sounds: Normal breath sounds. No wheezing or rales.  Abdominal:     General: Bowel sounds are normal. There is no distension.     Palpations: Abdomen is soft. There is no mass.     Tenderness: There is no abdominal tenderness.  Musculoskeletal:        General: No tenderness. Normal range of motion.     Cervical back: Normal range of motion and neck supple.     Right lower leg: Edema present.     Left lower leg: No edema.  Lymphadenopathy:     Cervical: No cervical adenopathy.  Skin:    General: Skin is warm and dry.     Findings: Rash present. No erythema.     Comments: The patient has a dry flaky pruritic rash on the bilateral lower  extremities in a patchy distribution  Neurological:     Mental Status: He is alert.     Coordination: Coordination normal.  Psychiatric:        Behavior: Behavior normal.     ED Results / Procedures / Treatments   Labs (all labs ordered are listed, but only abnormal results are displayed) Labs Reviewed  BASIC METABOLIC PANEL - Abnormal; Notable for the following components:      Result Value   CO2 20 (*)    Glucose, Bld 120 (*)    All other components within normal limits  CBC WITH DIFFERENTIAL/PLATELET    EKG None  Radiology No results found.  Procedures Procedures    Medications Ordered in ED Medications  oxyCODONE-acetaminophen (PERCOCET/ROXICET) 5-325 MG per tablet 1 tablet (1 tablet Oral Given 01/14/24 1447)  diphenhydrAMINE (BENADRYL) capsule 25 mg (25 mg Oral Given 01/14/24 1448)    ED Course/ Medical Decision Making/ A&P                                 Medical Decision Making  Mild edema right compared to left, no shortness of breath or chest pain, vitals reflect hypertension.  Will treat for morbid dermatitis with steroid based treatment, will also start an antihypertensive and have the patient follow-up.  I showed him on his paperwork where [facility] is [facility] and he is agreeable to follow-up.  Stable for discharge        Final Clinical Impression(s) / ED Diagnoses Final diagnoses:  Dermatitis, unspecified    Rx / DC Orders ED Discharge Orders          Ordered    triamcinolone cream (KENALOG) 0.1 %  2 times daily        01/14/24 1617    methylPREDNISolone (MEDROL DOSEPAK) 4 MG TBPK tablet        01/14/24 1617              Eber Hong, MD 01/14/24 [phone]

## 2024-01-14 NOTE — Discharge Instructions (Signed)
 Triamcinolone cream twice a day  Medrol Dosepak over the next 6 days  Thank you for allowing [place] to treat you in the emergency department today.  After reviewing your examination and potential testing that was done it appears that you are safe to go home.  I would like for you to follow-up with your doctor within the next several days, have them obtain your records and follow-up with them to review all potential tests and results from your visit.  If you should develop severe or worsening symptoms return to the emergency department immediately

## 2024-01-28 ENCOUNTER — Ambulatory Visit: Payer: Self-pay | Admitting: General Practice

## 2024-01-28 NOTE — Telephone Encounter (Signed)
  Chief Complaint: eczema flare-up  Symptoms: itching   Disposition: [] ED /[x] Urgent Care (no appt availability in office) / [] Appointment(In office/virtual)/ []  [employer] [facility]/ [] Home Care/ [] Refused Recommended Disposition /[] [facility]/ []  Follow-up with PCP Additional Notes: PT calling with flare-up  of eczema. The rash is on legs, hips, stomach, arms. Pt was seen in ED  on 3/13 was  told to f/u with PCP. PT is trying to establish care with  new provider. Pt appt is set for 6/12 at [facility] RN advised pt to go to Urgent Care to be evaluated since no appts in near future. Pt verbalized understanding.            Copied from CRM [id number]. Topic: Clinical - Red Word Triage >> Jan 28, 2024  9:36 AM [place] B wrote: [facility] that prompted transfer to Nurse Triage: pt has itchy rash that's spreading Reason for Disposition  SEVERE itching (i.e., interferes with sleep, normal activities or school)  Answer Assessment - Initial Assessment Questions 1. APPEARANCE of RASH: "Describe the rash." (e.g., spots, blisters, raised areas, skin peeling, scaly)     Sores  2. SIZE: "How big are the spots?" (e.g., tip of pen, eraser, coin; inches, centimeters)    Penny size  3. LOCATION: "Where is the rash located?"     Legs, hips, arms, stomach  4. COLOR: "What color is the rash?" (Note: It is difficult to assess rash color in people with darker-colored skin. When this situation occurs, simply ask the caller to describe what they see.)     Red  5. ONSET: "When did the rash begin?"     2 months  6. FEVER: "Do you have a fever?" If Yes, ask: "What is your temperature, how was it measured, and when did it start?"     Feels bad  7. ITCHING: "Does the rash itch?" If Yes, ask: "How bad is the itch?" (Scale 1-10; or mild, moderate, severe)     Yes- severe  8. CAUSE: "What do you think is causing the rash?"     Eczema   10. OTHER SYMPTOMS: "Do you have any other  symptoms?" (e.g., dizziness, headache, sore throat, joint pain)       headache  Protocols used: Rash or Redness - [facility]

## 2024-04-04 ENCOUNTER — Encounter: Payer: Self-pay | Admitting: General Practice

## 2024-04-14 ENCOUNTER — Ambulatory Visit: Payer: Self-pay | Admitting: Family

## 2024-07-04 ENCOUNTER — Other Ambulatory Visit: Payer: Self-pay

## 2024-07-04 ENCOUNTER — Emergency Department (HOSPITAL_COMMUNITY)

## 2024-07-04 ENCOUNTER — Encounter (HOSPITAL_COMMUNITY): Payer: Self-pay

## 2024-07-04 ENCOUNTER — Emergency Department (HOSPITAL_COMMUNITY): Admission: EM | Admit: 2024-07-04 | Discharge: 2024-07-04 | Disposition: A | Discharge status: Home / Self Care

## 2024-07-04 DIAGNOSIS — F10920 Alcohol use, unspecified with intoxication, uncomplicated: Secondary | ICD-10-CM

## 2024-07-04 DIAGNOSIS — F10120 Alcohol abuse with intoxication, uncomplicated: Secondary | ICD-10-CM | POA: Insufficient documentation

## 2024-07-04 DIAGNOSIS — Y908 Blood alcohol level of 240 mg/100 ml or more: Secondary | ICD-10-CM | POA: Insufficient documentation

## 2024-07-04 DIAGNOSIS — R451 Restlessness and agitation: Secondary | ICD-10-CM | POA: Diagnosis present

## 2024-07-04 LAB — CBC WITH DIFFERENTIAL/PLATELET
Abs Immature Granulocytes: 0.02 K/uL (ref 0.00–0.07)
Basophils Absolute: 0 K/uL (ref 0.0–0.1)
Basophils Relative: 1 %
Eosinophils Absolute: 0.1 K/uL (ref 0.0–0.5)
Eosinophils Relative: 2 %
HCT: 44.2 % (ref 39.0–52.0)
Hemoglobin: 15 g/dL (ref 13.0–17.0)
Immature Granulocytes: 0 %
Lymphocytes Relative: 40 %
Lymphs Abs: 2.4 K/uL (ref 0.7–4.0)
MCH: 30.9 pg (ref 26.0–34.0)
MCHC: 33.9 g/dL (ref 30.0–36.0)
MCV: 90.9 fL (ref 80.0–100.0)
Monocytes Absolute: 0.8 K/uL (ref 0.1–1.0)
Monocytes Relative: 13 %
Neutro Abs: 2.7 K/uL (ref 1.7–7.7)
Neutrophils Relative %: 44 %
Platelets: 183 K/uL (ref 150–400)
RBC: 4.86 MIL/uL (ref 4.22–5.81)
RDW: 14.6 % (ref 11.5–15.5)
WBC: 6.1 K/uL (ref 4.0–10.5)
nRBC: 0 % (ref 0.0–0.2)

## 2024-07-04 LAB — COMPREHENSIVE METABOLIC PANEL WITH GFR
ALT: 14 U/L (ref 0–44)
AST: 22 U/L (ref 15–41)
Albumin: 3.3 g/dL — ABNORMAL LOW (ref 3.5–5.0)
Alkaline Phosphatase: 60 U/L (ref 38–126)
Anion gap: 11 (ref 5–15)
BUN: <5 mg/dL — ABNORMAL LOW (ref 8–23)
CO2: 22 mmol/L (ref 22–32)
Calcium: 8.7 mg/dL — ABNORMAL LOW (ref 8.9–10.3)
Chloride: 101 mmol/L (ref 98–111)
Creatinine, Ser: 0.82 mg/dL (ref 0.61–1.24)
GFR, Estimated: >60 mL/min (ref >60)
Glucose, Bld: 110 mg/dL — ABNORMAL HIGH (ref 70–99)
Potassium: 3.4 mmol/L — ABNORMAL LOW (ref 3.5–5.1)
Sodium: 134 mmol/L — ABNORMAL LOW (ref 135–145)
Total Bilirubin: 0.5 mg/dL (ref 0.0–1.2)
Total Protein: 7.1 g/dL (ref 6.5–8.1)

## 2024-07-04 LAB — ETHANOL: Alcohol, Ethyl (B): 357 mg/dL (ref <15)

## 2024-07-04 LAB — RAPID URINE DRUG SCREEN, HOSP PERFORMED
Amphetamines: NOT DETECTED
Barbiturates: NOT DETECTED
Benzodiazepines: POSITIVE — AB
Cocaine: NOT DETECTED
Opiates: NOT DETECTED
Tetrahydrocannabinol: POSITIVE — AB

## 2024-07-04 LAB — AMMONIA: Ammonia: 40 umol/L — ABNORMAL HIGH (ref 9–35)

## 2024-07-04 NOTE — Discharge Instructions (Signed)
 Please try to cut back on your drinking.  Follow-up with your doctor and return to the ER for worsening symptoms.

## 2024-07-04 NOTE — ED Notes (Addendum)
 Pt found standing at the end of his bed disoriented.  He had no idea where he was or the circumstances on why he was brought to the ED.  RNs attempted to reorient him.  He used the urinal w/ two RNs assisting as he was very unstable standing.  Pt was assisted back to the bed and given a warm blanket to assist him back to sleep.  Bed alarm was placed and turned on while pt was standing.

## 2024-07-04 NOTE — ED Notes (Addendum)
 Patient ambulated to bathroom. No assistance needed & steady on feet.

## 2024-07-04 NOTE — ED Provider Notes (Signed)
 [place] EMERGENCY DEPARTMENT AT [facility] Provider Note   CSN: [id number] Arrival date & time: 07/04/24  1718     Patient presents with: ETOH/Agitated   [name] is a 63 y.o. male.   63 year old male with reported past medical history of alcohol abuse per medics presenting to the emergency department today with altered mental status.  The patient was apparently lying down in a [employer] parking lot.  When medics arrived the patient was agitated and was making threats towards them.  The patient was given Versed and has been sonorous since.  They did place a nasal airway for the patient.  His vital signs have been stable.  They did report that the patient did smell of alcohol initially when they arrived.  Bystanders reported to medics that the patient does drink heavily.        Prior to Admission medications   Medication Sig Start Date End Date Taking? Authorizing Provider  erythromycin  ophthalmic ointment Place a 1/2 inch ribbon of ointment over upper eye wound Patient not taking: Reported on 01/21/2020 10/04/16   Dansie, William, PA-C  fluconazole  (DIFLUCAN ) 150 MG tablet Take 1 tablet (150 mg total) by mouth once a week. 12/04/23   Tegeler, Lonni PARAS, MD  methylPREDNISolone  (MEDROL  DOSEPAK) 4 MG TBPK tablet Taper over 6 days 01/14/24   Cleotilde Rogue, MD  naproxen  (NAPROSYN ) 250 MG tablet Take 1 tablet (250 mg total) by mouth 2 (two) times daily with a meal. Patient not taking: Reported on 01/21/2020 10/04/16   Dansie, William, PA-C  triamcinolone  cream (KENALOG ) 0.1 % Apply 1 Application topically 2 (two) times daily. 01/14/24   Cleotilde Rogue, MD    Allergies: Patient has no known allergies.    Review of Systems  Reason unable to perform ROS: Altered mental status.    Updated Vital Signs BP (!) 116/90   Pulse 77   Temp 98 F (36.7 C) (Oral)   Resp 14   SpO2 98%   Physical Exam Vitals and nursing note reviewed.   Gen: Somnolent but will arouse to  painful stimuli Eyes: Pupils 2 mm and sluggish, EOMI HEENT: no oropharyngeal swelling Neck: trachea midline, c-collar in place Resp: clear to auscultation bilaterally Card: RRR, no murmurs, rubs, or gallops Abd: nontender, nondistended Extremities: no calf tenderness, no edema Vascular: 2+ radial pulses bilaterally, 2+ DP pulses bilaterally Neuro: Will move all extremities spontaneously with painful stimuli Skin: no rashes   (all labs ordered are listed, but only abnormal results are displayed) Labs Reviewed  COMPREHENSIVE METABOLIC PANEL WITH GFR - Abnormal; Notable for the following components:      Result Value   Sodium 134 (*)    Potassium 3.4 (*)    Glucose, Bld 110 (*)    BUN <5 (*)    Calcium 8.7 (*)    Albumin 3.3 (*)    All other components within normal limits  ETHANOL - Abnormal; Notable for the following components:   Alcohol, Ethyl (B) 357 (*)    All other components within normal limits  AMMONIA - Abnormal; Notable for the following components:   Ammonia 40 (*)    All other components within normal limits  RAPID URINE DRUG SCREEN, HOSP PERFORMED - Abnormal; Notable for the following components:   Benzodiazepines POSITIVE (*)    Tetrahydrocannabinol POSITIVE (*)    All other components within normal limits  CBC WITH DIFFERENTIAL/PLATELET    EKG: EKG Interpretation Date/Time:  Monday July 04 2024 17:27:48 EDT Ventricular  Rate:  87 PR Interval:  159 QRS Duration:  93 QT Interval:  371 QTC Calculation: 447 R Axis:   23  Text Interpretation: Sinus rhythm Low voltage, precordial leads Consider anterior infarct Confirmed by Ula Barter [phone]) on 07/04/2024 5:42:02 PM  Radiology: CT Cervical Spine Wo Contrast Result Date: 07/04/2024 CLINICAL DATA:  Neck trauma, dangerous injury mechanism (Age 66-64y) Found in parking lot. EXAM: CT CERVICAL SPINE WITHOUT CONTRAST TECHNIQUE: Multidetector CT imaging of the cervical spine was performed without intravenous  contrast. Multiplanar CT image reconstructions were also generated. RADIATION DOSE REDUCTION: This exam was performed according to the departmental dose-optimization program which includes automated exposure control, adjustment of the mA and/or kV according to patient size and/or use of iterative reconstruction technique. COMPARISON:  CT cervical spine 01/21/2020 FINDINGS: Alignment: Normal. Skull base and vertebrae: No acute fracture. Vertebral body heights are maintained. The dens and skull base are intact. Soft tissues and spinal canal: No canal hematoma. Breathing motion limits assessment of the prevertebral soft tissues. Disc levels:  C5-C6 degenerative disc disease. Upper chest: Only minimally included in the field of view. Other: None. IMPRESSION: 1. No acute fracture or subluxation of the cervical spine. 2. C5-C6 degenerative disc disease. Electronically Signed   By: Andrea Gasman M.D.   On: 07/04/2024 18:18   DG Chest Portable 1 View Result Date: 07/04/2024 CLINICAL DATA:  Altered mental status. EXAM: PORTABLE CHEST 1 VIEW COMPARISON:  10/10/2021 FINDINGS: Stable heart size and mediastinal contours allowing for patient rotation. Overall low lung volumes. No focal opacity, pulmonary edema, pleural effusion or pneumothorax. On limited assessment, no acute osseous findings. IMPRESSION: Low lung volumes without acute abnormality. Electronically Signed   By: Andrea Gasman M.D.   On: 07/04/2024 18:15   CT Head Wo Contrast Result Date: 07/04/2024 CLINICAL DATA:  Mental status change, unknown cause Found in parking lot. EXAM: CT HEAD WITHOUT CONTRAST TECHNIQUE: Contiguous axial images were obtained from the base of the skull through the vertex without intravenous contrast. RADIATION DOSE REDUCTION: This exam was performed according to the departmental dose-optimization program which includes automated exposure control, adjustment of the mA and/or kV according to patient size and/or use of iterative  reconstruction technique. COMPARISON:  Head CT 01/21/2020 FINDINGS: Brain: No intracranial hemorrhage, mass effect, or midline shift. Stable ventricular prominence likely due to central atrophy. The basilar cisterns are patent. Mild periventricular and deep white matter hypodensity typical of chronic small vessel ischemia. No evidence of territorial infarct or acute ischemia. No extra-axial or intracranial fluid collection. Vascular: Atherosclerosis of skullbase vasculature without hyperdense vessel or abnormal calcification. Skull: No fracture or focal lesion. Sinuses/Orbits: Nasal trumpet in place. Improved paranasal sinus inflammation from prior. No mastoid effusion. Other: None. IMPRESSION: 1. No acute intracranial abnormality. 2. Stable atrophy and chronic small vessel ischemia. Electronically Signed   By: Andrea Gasman M.D.   On: 07/04/2024 18:14     Procedures   Medications Ordered in the ED - No data to display                                  Medical Decision Making 63 year old male with reported history of alcohol abuse presenting to the emergency department today with altered mental status.  The patient was apparently agitated initially and now he is sonorous after Versed.  The patient is maintaining his airway at this time.  Is saturating at 100% on room air.  I will  obtain a CT scan of patient's head and cervical spine and obtain basic labs to evaluate for anemia or electrolyte abnormalities.  Will obtain a blood alcohol level on the patient in addition to a UDS.  We will monitor the patient's airway closely here.  The patient has an adequate respiratory rate and no hypoxia so we will hold off on Narcan at this time.  I will reevaluate for ultimate disposition.  The patient's blood alcohol level was significantly elevated here.  He was positive for THC as well on his UDS.  The patient was observed here and does not appear to have any acute traumatic injuries on CT imaging.  After hours  of observation the patient is awake and alert.  He is ambulatory without any assistance and is speaking with no slurred speech.  He is clinically sober and is requesting discharge.  He is not driving.  He is discharged with return precautions.  Amount and/or Complexity of Data Reviewed Labs: ordered. Radiology: ordered.        Final diagnoses:  Alcoholic intoxication without complication [facility])    ED Discharge Orders     None          Ula Prentice SAUNDERS, MD 07/04/24 2252

## 2024-07-04 NOTE — ED Notes (Signed)
 X-ray at bedside.

## 2024-07-04 NOTE — ED Notes (Signed)
 Patient transported to CT

## 2024-07-04 NOTE — ED Notes (Signed)
 Patient tolerating fluids and food.

## 2024-07-04 NOTE — ED Triage Notes (Signed)
 Pt BIB [facility] d/t being found down in a parking lot. EMS arrived & pt had shallow breathing, very agitated & shouting verbal threats. Hx of ETOH abuse, pupils small, c-collar applied & 2.5 Midrazolam given via 18 g Lt FA. Post meds he had periods of apnea & a Rt side nasal trumpet placed with 2L n/c. SBP 103, ST @ 103 bpm, CBG 117.
